# Patient Record
Sex: Female | Born: 1970 | Race: White | Hispanic: No | Marital: Single | Smoking: Current every day smoker
Health system: Southern US, Community
[De-identification: ages and names within clinical notes are randomized; demographics above are authoritative.]

## PROBLEM LIST (undated history)

## (undated) DIAGNOSIS — E119 Type 2 diabetes mellitus without complications: Secondary | ICD-10-CM

## (undated) DIAGNOSIS — I10 Essential (primary) hypertension: Secondary | ICD-10-CM

---

## 1997-12-30 ENCOUNTER — Ambulatory Visit (HOSPITAL_COMMUNITY): Admission: RE | Admit: 1997-12-30 | Discharge: 1997-12-30 | Payer: Self-pay | Admitting: Obstetrics and Gynecology

## 1998-07-01 ENCOUNTER — Inpatient Hospital Stay (HOSPITAL_COMMUNITY): Admission: AD | Admit: 1998-07-01 | Discharge: 1998-07-01 | Payer: Self-pay | Admitting: Obstetrics

## 1998-07-01 ENCOUNTER — Encounter: Payer: Self-pay | Admitting: Obstetrics and Gynecology

## 1999-06-29 ENCOUNTER — Other Ambulatory Visit: Admission: RE | Admit: 1999-06-29 | Discharge: 1999-06-29 | Payer: Self-pay | Admitting: Obstetrics and Gynecology

## 2000-07-06 ENCOUNTER — Other Ambulatory Visit: Admission: RE | Admit: 2000-07-06 | Discharge: 2000-07-06 | Payer: Self-pay | Admitting: Obstetrics and Gynecology

## 2001-11-20 ENCOUNTER — Other Ambulatory Visit: Admission: RE | Admit: 2001-11-20 | Discharge: 2001-11-20 | Payer: Self-pay | Admitting: Obstetrics and Gynecology

## 2001-11-27 ENCOUNTER — Encounter: Admission: RE | Admit: 2001-11-27 | Discharge: 2001-11-27 | Payer: Self-pay | Admitting: Obstetrics and Gynecology

## 2001-11-27 ENCOUNTER — Encounter: Payer: Self-pay | Admitting: Obstetrics and Gynecology

## 2003-01-22 ENCOUNTER — Other Ambulatory Visit: Admission: RE | Admit: 2003-01-22 | Discharge: 2003-01-22 | Payer: Self-pay | Admitting: Obstetrics and Gynecology

## 2003-08-13 ENCOUNTER — Emergency Department (HOSPITAL_COMMUNITY): Admission: EM | Admit: 2003-08-13 | Discharge: 2003-08-13 | Payer: Self-pay | Admitting: Emergency Medicine

## 2003-11-11 ENCOUNTER — Emergency Department (HOSPITAL_COMMUNITY): Admission: EM | Admit: 2003-11-11 | Discharge: 2003-11-11 | Payer: Self-pay | Admitting: Family Medicine

## 2005-03-11 ENCOUNTER — Other Ambulatory Visit: Admission: RE | Admit: 2005-03-11 | Discharge: 2005-03-11 | Payer: Self-pay | Admitting: Obstetrics and Gynecology

## 2006-01-11 ENCOUNTER — Emergency Department (HOSPITAL_COMMUNITY): Admission: EM | Admit: 2006-01-11 | Discharge: 2006-01-12 | Payer: Self-pay | Admitting: Emergency Medicine

## 2006-06-30 ENCOUNTER — Encounter: Admission: RE | Admit: 2006-06-30 | Discharge: 2006-06-30 | Payer: Self-pay | Admitting: Obstetrics and Gynecology

## 2008-06-22 ENCOUNTER — Emergency Department (HOSPITAL_COMMUNITY): Admission: EM | Admit: 2008-06-22 | Discharge: 2008-06-22 | Payer: Self-pay | Admitting: Family Medicine

## 2008-07-07 ENCOUNTER — Emergency Department (HOSPITAL_COMMUNITY): Admission: EM | Admit: 2008-07-07 | Discharge: 2008-07-07 | Payer: Self-pay | Admitting: Family Medicine

## 2008-09-05 ENCOUNTER — Encounter: Admission: RE | Admit: 2008-09-05 | Discharge: 2008-09-05 | Payer: Self-pay | Admitting: Obstetrics and Gynecology

## 2010-08-19 LAB — STREP A DNA PROBE: Group A Strep Probe: NEGATIVE

## 2010-08-19 LAB — POCT RAPID STREP A (OFFICE): Streptococcus, Group A Screen (Direct): NEGATIVE

## 2014-02-13 ENCOUNTER — Other Ambulatory Visit: Payer: Self-pay | Admitting: Obstetrics and Gynecology

## 2014-02-13 DIAGNOSIS — N6310 Unspecified lump in the right breast, unspecified quadrant: Secondary | ICD-10-CM

## 2014-02-20 ENCOUNTER — Other Ambulatory Visit: Payer: Self-pay

## 2014-03-28 ENCOUNTER — Ambulatory Visit
Admission: RE | Admit: 2014-03-28 | Discharge: 2014-03-28 | Disposition: A | Discharge status: Home / Self Care | Payer: 59 | Source: Ambulatory Visit | Attending: Obstetrics and Gynecology | Admitting: Obstetrics and Gynecology

## 2014-03-28 DIAGNOSIS — N6310 Unspecified lump in the right breast, unspecified quadrant: Secondary | ICD-10-CM

## 2014-07-18 ENCOUNTER — Encounter (HOSPITAL_COMMUNITY): Payer: Self-pay | Admitting: *Deleted

## 2014-07-18 ENCOUNTER — Emergency Department (HOSPITAL_COMMUNITY)
Admission: EM | Admit: 2014-07-18 | Discharge: 2014-07-18 | Disposition: A | Discharge status: Home / Self Care | Payer: 59 | Attending: Emergency Medicine | Admitting: Emergency Medicine

## 2014-07-18 DIAGNOSIS — Z720 Tobacco use: Secondary | ICD-10-CM | POA: Insufficient documentation

## 2014-07-18 DIAGNOSIS — I10 Essential (primary) hypertension: Secondary | ICD-10-CM | POA: Diagnosis not present

## 2014-07-18 DIAGNOSIS — K1379 Other lesions of oral mucosa: Secondary | ICD-10-CM | POA: Diagnosis present

## 2014-07-18 DIAGNOSIS — K122 Cellulitis and abscess of mouth: Secondary | ICD-10-CM | POA: Insufficient documentation

## 2014-07-18 HISTORY — DX: Essential (primary) hypertension: I10

## 2014-07-18 MED ORDER — NAPROXEN 500 MG PO TABS
500.0000 mg | ORAL_TABLET | Freq: Two times a day (BID) | ORAL | Status: DC
Start: 2014-07-18 — End: 2014-12-13

## 2014-07-18 MED ORDER — HYDROMORPHONE HCL 1 MG/ML IJ SOLN
2.0000 mg | Freq: Once | INTRAMUSCULAR | Status: AC
Start: 2014-07-18 — End: 2014-07-18
  Administered 2014-07-18: 2 mg via INTRAMUSCULAR
  Filled 2014-07-18: qty 2

## 2014-07-18 MED ORDER — BUPIVACAINE-EPINEPHRINE (PF) 0.5% -1:200000 IJ SOLN
1.8000 mL | Freq: Once | INTRAMUSCULAR | Status: AC
  Administered 2014-07-18: 1.8 mL
  Filled 2014-07-18: qty 1.8

## 2014-07-18 MED ORDER — PENICILLIN V POTASSIUM 500 MG PO TABS: 500.0000 mg | ORAL_TABLET | Freq: Four times a day (QID) | ORAL | Status: AC

## 2014-07-18 MED ORDER — HYDROCODONE-ACETAMINOPHEN 5-325 MG PO TABS: 1.0000 | ORAL_TABLET | ORAL | Status: DC | PRN

## 2014-07-18 MED ORDER — ONDANSETRON 4 MG PO TBDP
8.0000 mg | ORAL_TABLET | Freq: Once | ORAL | Status: AC
  Administered 2014-07-18: 8 mg via ORAL
  Filled 2014-07-18: qty 2

## 2014-07-18 NOTE — Discharge Instructions (Signed)
Please follow the directions provided. Be sure to attend your dental visit for further management of this pain. Please take naproxen twice a day to help with pain. You may take Vicodin for pain not relieved by the naproxen. Please take the penicillin until it is all gone to help with infection. Don't hesitate to return for any new, worsening, or concerning symptoms.   SEEK IMMEDIATE MEDICAL CARE IF:  You develop increased pain, swelling, redness, drainage, or bleeding in the wound site.  You develop signs of generalized infection including muscle aches, chills, fever, or a general ill feeling.  An oral temperature above 102 F (38.9 C) develops, not controlled by medication.

## 2014-07-18 NOTE — ED Notes (Signed)
The pt has a lesion in the   Top of her mouth since Wednesday.  C/o extreme pain.Marland Kitchen.  lmp none iud

## 2014-07-18 NOTE — ED Provider Notes (Signed)
CSN: [id number]     Arrival date & time 07/18/14  1949 History   First MD Initiated Contact with Patient 07/18/14 2124     Chief Complaint  Patient presents with  . Mouth Lesions    (Consider location/radiation/quality/duration/timing/severity/associated sxs/prior Treatment) HPI  Terri Black is a 44 year old female presenting with pain in the roof of her mouth. She states she noticed a swollen area behind her front teeth 2 days ago. She states the pain and swelling got progressively worse. She went to [place] dentist yesterday who did x-rays and told her her teeth were okay but she has had infection in her gums. She describes the pain as aching and rates as 10/10. She denies any fevers, chills, nausea, vomiting or recent injury to the area.     Past Medical History  Diagnosis Date  . Hypertension    History reviewed. No pertinent past surgical history. No family history on file. History  Substance Use Topics  . Smoking status: Current Every Day Smoker  . Smokeless tobacco: Not on file  . Alcohol Use: Yes   OB History    No data available     Review of Systems  Constitutional: Negative for fever and chills.  HENT: Positive for mouth sores.   Gastrointestinal: Negative for nausea and vomiting.  Skin: Positive for wound.    Allergies  Review of patient's allergies indicates no known allergies.  Home Medications   Prior to Admission medications   Not on File   BP 190/94 mmHg  Pulse 98  Temp(Src) 98.7 F (37.1 C) (Oral)  Resp 24  Ht  (1.676 m)  Wt 270 lb (122.471 kg)  BMI 43.60 kg/m2  SpO2 98% Physical Exam  Constitutional: She appears well-developed and well-nourished. No distress.  HENT:  Head: Normocephalic and atraumatic.  Mouth/Throat:    Swelling, erythema and fluctuance to hard palate directly behind left upper incisor.   Eyes: Conjunctivae are normal.  Neck: Neck supple.  Cardiovascular: Normal rate, regular rhythm and intact distal pulses.     Pulmonary/Chest: Effort normal and breath sounds normal. No respiratory distress.  Lymphadenopathy:    She has no cervical adenopathy.  Neurological: She is alert.  Skin: Skin is warm and dry. No rash noted. She is not diaphoretic.  Psychiatric: She has a normal mood and affect.  Nursing note and vitals reviewed.   ED Course  Procedures (including critical care time)  INCISION AND DRAINAGE Performed by: Harle Battiest Consent: Verbal consent obtained. Risks and benefits: risks, benefits and alternatives were discussed Type: abscess  Body area: hard palate  Anesthesia: local infiltration  Aspirated with 18 ga needle.  Local anesthetic: lidocaine 1% without epinephrine  Anesthetic total: 2 ml  Complexity: simple  Drainage: purulent  Drainage amount: Small  Packing material: None  Patient tolerance: Patient tolerated the procedure well with no immediate complications.  Labs Review Labs Reviewed - No data to display  Imaging Review No results found.   EKG Interpretation None      MDM   Final diagnoses:  Oral abscess   44 yo with swelling, erythema and fluctuance to the hard palate. Pt numbed with local infiltration and small amount of purulent discharge aspirated with 18 ga needle.  Pt reports relief of pain. Exam unconcerning for spread of infection. Will treat with penicillin and pain medicine.  Pt is well-appearing, in no acute distress and vital signs reviewed and not concerning. She appears safe to be discharged.  Discharge include follow-up with  her dentist. Return precautions provided. Pt aware of plan and in agreement.    Filed Vitals:   07/18/14 2013  BP: 190/94  Pulse: 98  Temp: 98.7 F (37.1 C)  TempSrc: Oral  Resp: 24  Height: 5\' 6"  (1.676 m)  Weight: 270 lb (122.471 kg)  SpO2: 98%   Meds given in ED:  Medications  HYDROmorphone (DILAUDID) injection 2 mg (2 mg Intramuscular Given 07/18/14 2142)  ondansetron (ZOFRAN-ODT)  disintegrating tablet 8 mg (8 mg Oral Given 07/18/14 2144)  bupivacaine-epinephrine (MARCAINE W/ EPI) 0.5% -1:200000 injection 1.8 mL (1.8 mLs Infiltration Given 07/18/14 2144)    Discharge Medication List as of 07/18/2014 10:24 PM    START taking these medications   Details  HYDROcodone-acetaminophen (NORCO/VICODIN) 5-325 MG per tablet Take 1 tablet by mouth every 4 (four) hours as needed., Starting 07/18/2014, Until Discontinued, Print    naproxen (NAPROSYN) 500 MG tablet Take 1 tablet (500 mg total) by mouth 2 (two) times daily., Starting 07/18/2014, Until Discontinued, Print    penicillin v potassium (VEETID) 500 MG tablet Take 1 tablet (500 mg total) by mouth 4 (four) times daily., Starting 07/18/2014, Until Fri 07/25/14, Print           Harle BattiestElizabeth Ebba Goll, NP 07/19/14 1539  Nelva Nayobert Beaton, MD 07/20/14 [phone]

## 2014-09-09 ENCOUNTER — Other Ambulatory Visit: Payer: Self-pay | Admitting: Obstetrics & Gynecology

## 2014-09-09 DIAGNOSIS — R928 Other abnormal and inconclusive findings on diagnostic imaging of breast: Secondary | ICD-10-CM

## 2014-12-13 ENCOUNTER — Emergency Department (HOSPITAL_COMMUNITY)
Admission: EM | Admit: 2014-12-13 | Discharge: 2014-12-13 | Disposition: A | Discharge status: Home / Self Care | Payer: 59 | Attending: Emergency Medicine | Admitting: Emergency Medicine

## 2014-12-13 ENCOUNTER — Encounter (HOSPITAL_COMMUNITY): Payer: Self-pay

## 2014-12-13 DIAGNOSIS — E119 Type 2 diabetes mellitus without complications: Secondary | ICD-10-CM | POA: Insufficient documentation

## 2014-12-13 DIAGNOSIS — Z79899 Other long term (current) drug therapy: Secondary | ICD-10-CM | POA: Diagnosis not present

## 2014-12-13 DIAGNOSIS — N764 Abscess of vulva: Secondary | ICD-10-CM | POA: Insufficient documentation

## 2014-12-13 DIAGNOSIS — Z720 Tobacco use: Secondary | ICD-10-CM | POA: Insufficient documentation

## 2014-12-13 DIAGNOSIS — I10 Essential (primary) hypertension: Secondary | ICD-10-CM | POA: Diagnosis not present

## 2014-12-13 HISTORY — DX: Type 2 diabetes mellitus without complications: E11.9

## 2014-12-13 LAB — CBG MONITORING, ED: GLUCOSE-CAPILLARY: 121 mg/dL — AB (ref 65–99)

## 2014-12-13 MED ORDER — LIDOCAINE-EPINEPHRINE (PF) 2 %-1:200000 IJ SOLN
10.0000 mL | Freq: Once | INTRAMUSCULAR | Status: AC
  Administered 2014-12-13: 10 mL
  Filled 2014-12-13: qty 20

## 2014-12-13 MED ORDER — OXYCODONE-ACETAMINOPHEN 5-325 MG PO TABS: 2.0000 | ORAL_TABLET | Freq: Once | ORAL | Status: DC

## 2014-12-13 MED ORDER — DOXYCYCLINE HYCLATE 100 MG PO CAPS: 100.0000 mg | ORAL_CAPSULE | Freq: Two times a day (BID) | ORAL | Status: DC

## 2014-12-13 MED ORDER — NAPROXEN 500 MG PO TABS: 500.0000 mg | ORAL_TABLET | Freq: Two times a day (BID) | ORAL | Status: AC

## 2014-12-13 MED ORDER — HYDROCODONE-ACETAMINOPHEN 5-325 MG PO TABS
1.0000 | ORAL_TABLET | Freq: Four times a day (QID) | ORAL | Status: DC | PRN
Start: 2014-12-13 — End: 2016-11-23

## 2014-12-13 NOTE — ED Provider Notes (Signed)
CSN: [id number]     Arrival date & time 12/13/14  0828 History   First MD Initiated Contact with Patient 12/13/14 [phone]     Chief Complaint  Patient presents with  . Abscess     (Consider location/radiation/quality/duration/timing/severity/associated sxs/prior Treatment) HPI Comments: Patient presents today with an abscess of the left labia.  She states that the abscess has been present for the past 2 days and is becoming larger.  Abscess is very painful.  She has not noticed any drainage.  She has been taking Ibuprofen for pain with little relief.  Pain worse when sitting and with palpation.  She denies any history of abscesses.  She does report a history of DM Type II.  She reports that her blood sugar has been running around 130.  She denies nausea, vomiting, fever, or chills.   Patient is a 44 y.o. female presenting with abscess. The history is provided by the patient.  Abscess   Past Medical History  Diagnosis Date  . Hypertension   . Diabetes mellitus without complication     Type 2   Past Surgical History  Procedure Laterality Date  . Cesarean section     No family history on file. History  Substance Use Topics  . Smoking status: Current Every Day Smoker -- 0.50 packs/day  . Smokeless tobacco: Not on file  . Alcohol Use: No   OB History    No data available     Review of Systems  All other systems reviewed and are negative.     Allergies  Shellfish allergy  Home Medications   Prior to Admission medications   Medication Sig Start Date End Date Taking? Authorizing Provider  canagliflozin (INVOKANA) 300 MG TABS tablet Take 300 mg by mouth daily before breakfast.   Yes Historical Provider, MD  citalopram (CELEXA) 20 MG tablet Take 20 mg by mouth daily.   Yes Historical Provider, MD  insulin detemir (LEVEMIR) 100 UNIT/ML injection Inject 20 Units into the skin daily.   Yes Historical Provider, MD  POTASSIUM PO Take 1 tablet by mouth daily.   Yes Historical  Provider, MD  sitaGLIPtin-metformin (JANUMET) 50-1000 MG per tablet Take 1 tablet by mouth daily.   Yes Historical Provider, MD  spironolactone-hydrochlorothiazide (ALDACTAZIDE) 25-25 MG per tablet Take 1 tablet by mouth daily.   Yes Historical Provider, MD  HYDROcodone-acetaminophen (NORCO/VICODIN) 5-325 MG per tablet Take 1 tablet by mouth every 4 (four) hours as needed. Patient not taking: Reported on 12/13/2014 07/18/14   Harle Battiest, NP  naproxen (NAPROSYN) 500 MG tablet Take 1 tablet (500 mg total) by mouth 2 (two) times daily. Patient not taking: Reported on 12/13/2014 07/18/14   Harle Battiest, NP   BP 130/62 mmHg  Pulse 91  Temp(Src) 98.4 F (36.9 C) (Oral)  Resp 16  Ht  (1.676 m)  Wt 232 lb (105.235 kg)  BMI 37.46 kg/m2  SpO2 97% Physical Exam  Constitutional: She appears well-developed and well-nourished.  HENT:  Head: Normocephalic and atraumatic.  Mouth/Throat: Oropharynx is clear and moist.  Neck: Normal range of motion. Neck supple.  Cardiovascular: Normal rate, regular rhythm and normal heart sounds.   Pulmonary/Chest: Effort normal and breath sounds normal.  Abdominal: Soft. Bowel sounds are normal. She exhibits no distension and no mass. There is no tenderness. There is no rebound and no guarding.  Genitourinary:  Diffuse edema and erythema of the left labia.  No drainage.  No warmth.  No obvious fluctuance.    Neurological:  She is alert.  Skin: Skin is warm and dry.  Nursing note and vitals reviewed.   ED Course  Procedures (including critical care time) Labs Review Labs Reviewed  CBG MONITORING, ED    Imaging Review No results found.   EKG Interpretation None     INCISION AND DRAINAGE Performed by: Santiago Glad Consent: Verbal consent obtained. Risks and benefits: risks, benefits and alternatives were discussed Type: abscess  Body area: left labia  Anesthesia: local infiltration  Incision was made with a scalpel.  Local  anesthetic: lidocaine 2% with epinephrine  Anesthetic total: 6 ml  Complexity: complex Blunt dissection to break up loculations  Drainage: purulent  Drainage amount: small  Patient tolerance: Patient tolerated the procedure well with no immediate complications.    MDM   Final diagnoses:  None   Patient presents today with a chief complaint of a left labial abscess x 2 days.  Area very tender to palpation.  Area incised and drained in the ED with small amount of drainage.  She does have diffuse edema of the left labia.  She denies any recent sexual intercourse.  Patient started on antibiotics and instructed to use warm compresses.  Instructed to follow up with PCP in 2 days to have the area rechecked.  Patient stable for discharge.  Return precautions given.      Santiago Glad, PA-C 12/13/14 1037  Azalia Bilis, MD 12/13/14 [phone]

## 2014-12-13 NOTE — ED Notes (Signed)
Pt. Presents with complaint of potential abscess to vaginal/buttock area. Pt. States she noticed it approx 2 days ago. Pain and size worsening since.

## 2014-12-13 NOTE — ED Notes (Signed)
Pt departing ED without any complaints. Denies pain. Verbalizes understanding of discharge instructions. NAD. A/O x4. Ambulatory with steady gait

## 2015-01-03 ENCOUNTER — Encounter (HOSPITAL_COMMUNITY): Payer: Self-pay | Admitting: Emergency Medicine

## 2015-01-03 ENCOUNTER — Emergency Department (HOSPITAL_COMMUNITY)
Admission: EM | Admit: 2015-01-03 | Discharge: 2015-01-03 | Disposition: A | Discharge status: Home / Self Care | Payer: 59 | Attending: Emergency Medicine | Admitting: Emergency Medicine

## 2015-01-03 DIAGNOSIS — X30XXXA Exposure to excessive natural heat, initial encounter: Secondary | ICD-10-CM | POA: Insufficient documentation

## 2015-01-03 DIAGNOSIS — Z791 Long term (current) use of non-steroidal anti-inflammatories (NSAID): Secondary | ICD-10-CM | POA: Diagnosis not present

## 2015-01-03 DIAGNOSIS — Y998 Other external cause status: Secondary | ICD-10-CM | POA: Diagnosis not present

## 2015-01-03 DIAGNOSIS — T675XXA Heat exhaustion, unspecified, initial encounter: Secondary | ICD-10-CM | POA: Diagnosis not present

## 2015-01-03 DIAGNOSIS — I10 Essential (primary) hypertension: Secondary | ICD-10-CM | POA: Insufficient documentation

## 2015-01-03 DIAGNOSIS — Z794 Long term (current) use of insulin: Secondary | ICD-10-CM | POA: Diagnosis not present

## 2015-01-03 DIAGNOSIS — Y9289 Other specified places as the place of occurrence of the external cause: Secondary | ICD-10-CM | POA: Diagnosis not present

## 2015-01-03 DIAGNOSIS — E119 Type 2 diabetes mellitus without complications: Secondary | ICD-10-CM | POA: Insufficient documentation

## 2015-01-03 DIAGNOSIS — E876 Hypokalemia: Secondary | ICD-10-CM | POA: Diagnosis not present

## 2015-01-03 DIAGNOSIS — Z79899 Other long term (current) drug therapy: Secondary | ICD-10-CM | POA: Diagnosis not present

## 2015-01-03 DIAGNOSIS — R112 Nausea with vomiting, unspecified: Secondary | ICD-10-CM | POA: Diagnosis present

## 2015-01-03 DIAGNOSIS — Z720 Tobacco use: Secondary | ICD-10-CM | POA: Insufficient documentation

## 2015-01-03 DIAGNOSIS — Y9389 Activity, other specified: Secondary | ICD-10-CM | POA: Diagnosis not present

## 2015-01-03 LAB — I-STAT CHEM 8, ED
BUN: 14 mg/dL (ref 6–20)
CALCIUM ION: 1.15 mmol/L (ref 1.12–1.23)
CREATININE: 0.9 mg/dL (ref 0.44–1.00)
Chloride: 103 mmol/L (ref 101–111)
GLUCOSE: 135 mg/dL — AB (ref 65–99)
HCT: 46 % (ref 36.0–46.0)
HEMOGLOBIN: 15.6 g/dL — AB (ref 12.0–15.0)
Potassium: 3.2 mmol/L — ABNORMAL LOW (ref 3.5–5.1)
Sodium: 140 mmol/L (ref 135–145)
TCO2: 21 mmol/L (ref 0–100)

## 2015-01-03 MED ORDER — POTASSIUM CHLORIDE CRYS ER 20 MEQ PO TBCR
40.0000 meq | EXTENDED_RELEASE_TABLET | Freq: Once | ORAL | Status: AC
  Administered 2015-01-03: 40 meq via ORAL
  Filled 2015-01-03: qty 2

## 2015-01-03 NOTE — ED Provider Notes (Signed)
CSN: [id number]     Arrival date & time 01/03/15  1016 History   First MD Initiated Contact with Patient 01/03/15 1025     Chief Complaint  Patient presents with  . Nausea  . Emesis  . Dizziness     (Consider location/radiation/quality/duration/timing/severity/associated sxs/prior Treatment) HPI Comments: Patient presents with nausea and vomiting. She has a history of hypertension and diabetes. She was running a 5K this morning. She states she got about 2 miles and started feeling lightheaded. She stopped running and started walking. She did have multiple episodes of watery emesis. It was nonbloody and nonbilious. She states she did not eat breakfast this morning. She was drinking water. She states this is the first 5K that she has run since she's recently started exercising. She was given 700 cc of normal saline prior to arrival by EMS. She states she feels 100% better right now. She denies any ongoing nausea. She denies abdominal pain. She states she was feeling fine yesterday and this morning. She denies any other recent illnesses. There's been no chest pain or shortness of breath. No urinary symptoms. She has an IUD in place.  Patient is a 44 y.o. female presenting with vomiting and dizziness.  Emesis Associated symptoms: no abdominal pain, no arthralgias, no chills, no diarrhea and no headaches   Dizziness Associated symptoms: nausea and vomiting   Associated symptoms: no blood in stool, no chest pain, no diarrhea, no headaches, no shortness of breath and no weakness     Past Medical History  Diagnosis Date  . Hypertension   . Diabetes mellitus without complication     Type 2   Past Surgical History  Procedure Laterality Date  . Cesarean section     No family history on file. Social History  Substance Use Topics  . Smoking status: Current Every Day Smoker -- 0.50 packs/day  . Smokeless tobacco: None  . Alcohol Use: No   OB History    No data available     Review of  Systems  Constitutional: Positive for fatigue. Negative for fever, chills and diaphoresis.  HENT: Negative for congestion, rhinorrhea and sneezing.   Eyes: Negative.   Respiratory: Negative for cough, chest tightness and shortness of breath.   Cardiovascular: Negative for chest pain and leg swelling.  Gastrointestinal: Positive for nausea and vomiting. Negative for abdominal pain, diarrhea and blood in stool.  Genitourinary: Negative for frequency, hematuria, flank pain and difficulty urinating.  Musculoskeletal: Negative for back pain and arthralgias.  Skin: Negative for rash.  Neurological: Positive for dizziness and light-headedness. Negative for speech difficulty, weakness, numbness and headaches.      Allergies  Shellfish allergy  Home Medications   Prior to Admission medications   Medication Sig Start Date End Date Taking? Authorizing Provider  canagliflozin (INVOKANA) 300 MG TABS tablet Take 300 mg by mouth daily before breakfast.    Historical Provider, MD  citalopram (CELEXA) 20 MG tablet Take 20 mg by mouth daily.    Historical Provider, MD  doxycycline (VIBRAMYCIN) 100 MG capsule Take 1 capsule (100 mg total) by mouth 2 (two) times daily. 12/13/14   Santiago Glad, PA-C  HYDROcodone-acetaminophen (NORCO/VICODIN) 5-325 MG per tablet Take 1-2 tablets by mouth every 6 (six) hours as needed. 12/13/14   Heather Laisure, PA-C  insulin detemir (LEVEMIR) 100 UNIT/ML injection Inject 20 Units into the skin daily.    Historical Provider, MD  naproxen (NAPROSYN) 500 MG tablet Take 1 tablet (500 mg total) by mouth 2 (two)  times daily. 12/13/14   Santiago Glad, PA-C  POTASSIUM PO Take 1 tablet by mouth daily.    Historical Provider, MD  sitaGLIPtin-metformin (JANUMET) 50-1000 MG per tablet Take 1 tablet by mouth daily.    Historical Provider, MD  spironolactone-hydrochlorothiazide (ALDACTAZIDE) 25-25 MG per tablet Take 1 tablet by mouth daily.    Historical Provider, MD   BP 133/72 mmHg   Pulse 98  Temp(Src) 97.9 F (36.6 C) (Oral)  Resp 20  SpO2 98% Physical Exam  Constitutional: She is oriented to person, place, and time. She appears well-developed and well-nourished.  HENT:  Head: Normocephalic and atraumatic.  Eyes: Pupils are equal, round, and reactive to light.  Neck: Normal range of motion. Neck supple.  Cardiovascular: Normal rate, regular rhythm and normal heart sounds.   Pulmonary/Chest: Effort normal and breath sounds normal. No respiratory distress. She has no wheezes. She has no rales. She exhibits no tenderness.  Abdominal: Soft. Bowel sounds are normal. There is no tenderness. There is no rebound and no guarding.  Musculoskeletal: Normal range of motion. She exhibits no edema.  Lymphadenopathy:    She has no cervical adenopathy.  Neurological: She is alert and oriented to person, place, and time.  Skin: Skin is warm and dry. No rash noted.  Psychiatric: She has a normal mood and affect.    ED Course  Procedures (including critical care time) Labs Review Labs Reviewed  I-STAT CHEM 8, ED - Abnormal; Notable for the following:    Potassium 3.2 (*)    Glucose, Bld 135 (*)    Hemoglobin 15.6 (*)    All other components within normal limits    Imaging Review No results found. I have personally reviewed and evaluated these images and lab results as part of my medical decision-making.   EKG Interpretation None      MDM   Final diagnoses:  Heat exhaustion, initial encounter  Hypokalemia    Patient is very much better after being brought into a cooler environment and given IV fluids. She's been asymptomatic since arrival to the ED. She's eaten some crackers without difficulty. Her potassium was slightly low and she was given a dose of K Dur here in the ED. I advised her to not take her regular dose of potassium today but to start back on her regular dose tomorrow. I advised her follow-up with her primary care physician if she has any ongoing  symptoms or return here as needed for any worsening symptoms.    Rolan Bucco, MD 01/03/15 [phone]

## 2015-01-03 NOTE — ED Notes (Signed)
[facility]. From scene. Running in 5k this am. Felt overheated, multiple emesis 18g Left AC. NS given

## 2015-01-03 NOTE — Discharge Instructions (Signed)
Heat-Related Illness °Heat-related illnesses occur when the body is unable to properly cool itself. The body normally cools itself by sweating. However, under some conditions sweating is not enough. In these cases, a person's body temperature rises rapidly. Very high body temperatures may damage the brain or other vital organs. Some examples of heat-related illnesses include: °· Heat stroke. This occurs when the body is unable to regulate its temperature. The body's temperature rises rapidly, the sweating mechanism fails, and the body is unable to cool down. Body temperature may rise to 106° F (41° C) or higher within 10 to 15 minutes. Heat stroke can cause death or permanent disability if emergency treatment is not provided. °· Heat exhaustion. This is a milder form of heat-related illness that can develop after several days of exposure to high temperatures and not enough fluids. It is the body's response to an excessive loss of the water and salt contained in sweat. °· Heat cramps. These usually affect people who sweat a lot during heavy activity. This sweating drains the body's salt and moisture. The low salt level in the muscles causes painful cramps. Heat cramps may also be a symptom of heat exhaustion. Heat cramps usually occur in the abdomen, arms, or legs. Get medical attention for cramps if you have heart problems or are on a low-sodium diet. °Those that are at greatest risk for heat-related illnesses include:  °· The elderly. °· Infant and the very young. °· People with mental illness and chronic diseases. °· People who are overweight (obese). °· Young and healthy people can even succumb to heat if they participate in strenuous physical activities during hot weather. °CAUSES  °Several factors affect the body's ability to cool itself during extremely hot weather. When the humidity is high, sweat will not evaporate as quickly. This prevents the body from releasing heat quickly. Other factors that can affect  the body's ability to cool down include:  °· Age. °· Obesity. °· Fever. °· Dehydration. °· Heart disease. °· Mental illness. °· Poor circulation. °· Sunburn. °· Prescription drug use. °· Alcohol use. °SYMPTOMS  °Heat stroke: Warning signs of heat stroke vary, but may include: °· An extremely high body temperature (above 103°F orally). °· A fast, strong pulse. °· Dizziness. °· Confusion. °· Red, hot, and dry skin. °· No sweating. °· Throbbing headache. °· Feeling sick to your stomach (nauseous). °· Unconsciousness. °Heat exhaustion: Warning signs of heat exhaustion include: °· Heavy sweating. °· Tiredness. °· Headache. °· Paleness. °· Weakness. °· Feeling sick to your stomach (nauseous) or vomiting. °· Muscle cramps. °Heat cramps °· Muscle pains or spasms. °TREATMENT  °Heat stroke °· Get into a cool environment. An indoor place that is air-conditioned may be best. °· Take a cool shower or bath. Have someone around to make sure you are okay. °· Take your temperature. Make sure it is going down. °Heat exhaustion °· Drink plenty of fluids. Do not drink liquids that contain caffeine, alcohol, or large amounts of sugar. These cause you to lose more body fluid. Also, avoid very cold drinks. They can cause stomach cramps. °· Get into a cool environment. An indoor place that is air-conditioned may be best. °· Take a cool shower or bath. Have someone around to make sure you are okay. °· Put on lightweight clothing. °Heat cramps °· Stop whatever activity you were doing. Do not attempt to do that activity for at least 3 hours after the cramps have gone away. °· Get into a cool environment. An indoor   place that is air-conditioned may be best. HOME CARE INSTRUCTIONS  To protect your health when temperatures are extremely high, follow these tips:  During heavy exercise in a hot environment, drink two to four glasses (16-32 ounces) of cool fluids each hour. Do not wait until you are thirsty to drink. Warning: If your caregiver  limits the amount of fluid you drink or has you on water pills, ask how much you should drink while the weather is hot.  Do not drink liquids that contain caffeine, alcohol, or large amounts of sugar. These cause you to lose more body fluid.  Avoid very cold drinks. They can cause stomach cramps.  Wear appropriate clothing. Choose lightweight, light-colored, loose-fitting clothing.  If you must be outdoors, try to limit your outdoor activity to morning and evening hours. Try to rest often in shady areas.  If you are not used to working or exercising in a hot environment, start slowly and pick up the pace gradually.  Stay cool in an air-conditioned place if possible. If your home does not have air conditioning, go to the shopping mall or [employer].  Taking a cool shower or bath may help you cool off. SEEK MEDICAL CARE IF:   You see any of the symptoms listed above. You may be dealing with a life-threatening emergency.  Symptoms worsen or last longer than 1 hour.  Heat cramps do not get better in 1 hour. MAKE SURE YOU:   Understand these instructions.  Will watch your condition.  Will get help right away if you are not doing well or get worse. Document Released: 02/02/2008 Document Revised: 07/18/2011 Document Reviewed: 02/02/2008 [facility] Patient Information 2015 [place], [place]. This information is not intended to replace advice given to you by your health care provider. Make sure you discuss any questions you have with your health care provider.  Hypokalemia Hypokalemia means that the amount of potassium in the blood is lower than normal.Potassium is a chemical, called an electrolyte, that helps regulate the amount of fluid in the body. It also stimulates muscle contraction and helps nerves function properly.Most of the body's potassium is inside of cells, and only a very small amount is in the blood. Because the amount in the blood is so small, minor changes can be  life-threatening. CAUSES  Antibiotics.  Diarrhea or vomiting.  Using laxatives too much, which can cause diarrhea.  Chronic kidney disease.  Water pills (diuretics).  Eating disorders (bulimia).  Low magnesium level.  Sweating a lot. SIGNS AND SYMPTOMS  Weakness.  Constipation.  Fatigue.  Muscle cramps.  Mental confusion.  Skipped heartbeats or irregular heartbeat (palpitations).  Tingling or numbness. DIAGNOSIS  Your health care provider can diagnose hypokalemia with blood tests. In addition to checking your potassium level, your health care provider may also check other lab tests. TREATMENT Hypokalemia can be treated with potassium supplements taken by mouth or adjustments in your current medicines. If your potassium level is very low, you may need to get potassium through a vein (IV) and be monitored in the hospital. A diet high in potassium is also helpful. Foods high in potassium are:  Nuts, such as peanuts and pistachios.  Seeds, such as sunflower seeds and pumpkin seeds.  Peas, lentils, and lima beans.  Whole grain and bran cereals and breads.  Fresh fruit and vegetables, such as apricots, avocado, bananas, cantaloupe, kiwi, oranges, tomatoes, asparagus, and potatoes.  Orange and tomato juices.  Red meats.  Fruit yogurt. HOME CARE INSTRUCTIONS  Take  all medicines as prescribed by your health care provider.  Maintain a healthy diet by including nutritious food, such as fruits, vegetables, nuts, whole grains, and lean meats.  If you are taking a laxative, be sure to follow the directions on the label. SEEK MEDICAL CARE IF:  Your weakness gets worse.  You feel your heart pounding or racing.  You are vomiting or having diarrhea.  You are diabetic and having trouble keeping your blood glucose in the normal range. SEEK IMMEDIATE MEDICAL CARE IF:  You have chest pain, shortness of breath, or dizziness.  You are vomiting or having diarrhea for  more than 2 days.  You faint. MAKE SURE YOU:   Understand these instructions.  Will watch your condition.  Will get help right away if you are not doing well or get worse. Document Released: 04/25/2005 Document Revised: 02/13/2013 Document Reviewed: 10/26/2012 [facility] Patient Information 2015 [place], [place]. This information is not intended to replace advice given to you by your health care provider. Make sure you discuss any questions you have with your health care provider.

## 2015-01-03 NOTE — ED Notes (Signed)
Pt via EMS stated, she over-heated , nauseated, vomiting and felt dizzy.  Pt. Didn't eat breakfast, but was hydrated, but vomited several times.

## 2015-02-26 ENCOUNTER — Emergency Department (HOSPITAL_COMMUNITY)
Admission: EM | Admit: 2015-02-26 | Discharge: 2015-02-26 | Disposition: A | Discharge status: Home / Self Care | Payer: 59 | Attending: Physician Assistant | Admitting: Physician Assistant

## 2015-02-26 ENCOUNTER — Encounter (HOSPITAL_COMMUNITY): Payer: Self-pay | Admitting: *Deleted

## 2015-02-26 DIAGNOSIS — L03211 Cellulitis of face: Secondary | ICD-10-CM | POA: Diagnosis not present

## 2015-02-26 DIAGNOSIS — E119 Type 2 diabetes mellitus without complications: Secondary | ICD-10-CM | POA: Diagnosis not present

## 2015-02-26 DIAGNOSIS — I10 Essential (primary) hypertension: Secondary | ICD-10-CM | POA: Diagnosis not present

## 2015-02-26 DIAGNOSIS — Z794 Long term (current) use of insulin: Secondary | ICD-10-CM | POA: Insufficient documentation

## 2015-02-26 DIAGNOSIS — Z720 Tobacco use: Secondary | ICD-10-CM | POA: Insufficient documentation

## 2015-02-26 DIAGNOSIS — Z79899 Other long term (current) drug therapy: Secondary | ICD-10-CM | POA: Insufficient documentation

## 2015-02-26 DIAGNOSIS — R600 Localized edema: Secondary | ICD-10-CM | POA: Diagnosis present

## 2015-02-26 MED ORDER — CLINDAMYCIN HCL 150 MG PO CAPS: 150.0000 mg | ORAL_CAPSULE | Freq: Four times a day (QID) | ORAL | Status: DC

## 2015-02-26 MED ORDER — CLINDAMYCIN HCL 150 MG PO CAPS
300.0000 mg | ORAL_CAPSULE | Freq: Once | ORAL | Status: AC
  Administered 2015-02-26: 300 mg via ORAL
  Filled 2015-02-26: qty 2

## 2015-02-26 NOTE — ED Notes (Signed)
Declined W/C at D/C and was escorted to lobby by RN. 

## 2015-02-26 NOTE — Discharge Instructions (Signed)

## 2015-02-26 NOTE — ED Notes (Addendum)
Pt reports a blister was on her lower lip and she opened  the blister. Now the lower lip ,chin and side of face is swollen . During assessment Pt rubbed her lip with hands . Pt instructed best practice was not to put hands on open area. Pt speaking in full sentences.No resp, distress.

## 2015-02-26 NOTE — ED Provider Notes (Signed)
CSN: [id number]     Arrival date & time 02/26/15  4098 History  By signing my name below, I, Jarvis Morgan, attest that this documentation has been prepared under the direction and in the presence of Marlon Pel, PA-C Electronically Signed: Jarvis Morgan, ED Scribe. 02/26/2015. 10:00 AM.    Chief Complaint  Patient presents with  . Oral Swelling   The history is provided by the patient. No language interpreter was used.    HPI Comments: LEANORE BIGGERS is a 44 y.o. female with a h/o DM who presents to the Emergency Department complaining of intermittent, oral swelling to her lower lip, chin and right side of face. She states the area started as a blister or bite to her lower lip which she then popped that develop on Tuesday, 3 days ago. She reports associated redness to the swelling area. Pt has been taking Benadryl with moderate relief. She states she has also been taking Naprosyn and applying ice to help with the swelling. Pt denies any aggravating factors. She states she has never had anything like this in the past. She denies any throat swelling, trouble swallowing, SOB, fever, chills, nausea or vomiting, weakness or headache. Pt denies any known medication allergies.   Past Medical History  Diagnosis Date  . Hypertension   . Diabetes mellitus without complication (HCC)     Type 2   Past Surgical History  Procedure Laterality Date  . Cesarean section    . Cesarean section     History reviewed. No pertinent family history. Social History  Substance Use Topics  . Smoking status: Current Every Day Smoker -- 0.50 packs/day  . Smokeless tobacco: None  . Alcohol Use: No   OB History    No data available     Review of Systems  Constitutional: Negative for fever and chills.  HENT: Positive for facial swelling. Negative for trouble swallowing.   Respiratory: Negative for shortness of breath.   Gastrointestinal: Negative for nausea and vomiting.  Skin: Positive for color  change.    Allergies  Shellfish allergy  Home Medications   Prior to Admission medications   Medication Sig Start Date End Date Taking? Authorizing Provider  canagliflozin (INVOKANA) 300 MG TABS tablet Take 300 mg by mouth daily before breakfast.    Historical Provider, MD  citalopram (CELEXA) 20 MG tablet Take 20 mg by mouth daily.    Historical Provider, MD  clindamycin (CLEOCIN) 150 MG capsule Take 1 capsule (150 mg total) by mouth every 6 (six) hours. 02/26/15   Marlon Pel, PA-C  doxycycline (VIBRAMYCIN) 100 MG capsule Take 1 capsule (100 mg total) by mouth 2 (two) times daily. 12/13/14   Santiago Glad, PA-C  HYDROcodone-acetaminophen (NORCO/VICODIN) 5-325 MG per tablet Take 1-2 tablets by mouth every 6 (six) hours as needed. 12/13/14   Heather Laisure, PA-C  insulin detemir (LEVEMIR) 100 UNIT/ML injection Inject 20 Units into the skin daily.    Historical Provider, MD  naproxen (NAPROSYN) 500 MG tablet Take 1 tablet (500 mg total) by mouth 2 (two) times daily. 12/13/14   Santiago Glad, PA-C  POTASSIUM PO Take 1 tablet by mouth daily.    Historical Provider, MD  sitaGLIPtin-metformin (JANUMET) 50-1000 MG per tablet Take 1 tablet by mouth daily.    Historical Provider, MD  spironolactone-hydrochlorothiazide (ALDACTAZIDE) 25-25 MG per tablet Take 1 tablet by mouth daily.    Historical Provider, MD   Triage Vitals: BP 123/86 mmHg  Pulse 86  Temp(Src) 98.6 F (37 C) (  Oral)  Resp 16  SpO2 98%  Physical Exam  Constitutional: She is oriented to person, place, and time. She appears well-developed and well-nourished. No distress.  HENT:  Head: Normocephalic and atraumatic.  Cellulitis to the bottom, lateral right lip with a tiny pin point Eschar to center. The swelling is to the chin, mainly on the right. It does not extend down the neck. The inside of her bottom lip is soft and non firm. No trismus. The cellulitis does not extend up past the mandible joint line. Associated induration  and erythema. No frank abscess noted.  Eyes: Conjunctivae and EOM are normal.  Neck: Neck supple. No tracheal deviation present.  Cardiovascular: Normal rate.   Pulmonary/Chest: Effort normal. No respiratory distress.  Musculoskeletal: Normal range of motion.  Neurological: She is alert and oriented to person, place, and time.  Skin: Skin is warm and dry.  Psychiatric: She has a normal mood and affect. Her behavior is normal.  Nursing note and vitals reviewed.   ED Course  Procedures (including critical care time)  DIAGNOSTIC STUDIES: Oxygen Saturation is 98% on RA, normal by my interpretation.    COORDINATION OF CARE:  9:48 AM- will give pt an rx for Clindamycin. Advised pt to return if swelling gets worse or she develops an abscess to the area.  Pt advised of plan for treatment and pt agrees.    Labs Review Labs Reviewed - No data to display  Imaging Review No results found. I have personally reviewed and evaluated these images and lab results as part of my medical decision-making.   EKG Interpretation None      MDM   Final diagnoses:  Cellulitis of face    Patient is had significant risk of are turning into an abscess. We had a long discussion about this. She understands the risk of abscess despite the antibiotics and agrees to return if worsening. She has been started on Clindamycin here in the ED. If she does not improve or worsens she will likely need admission. At this time no puss is expressing from pinpoint eschar. Likely pt was bit by an unknown spider.  Medications  clindamycin (CLEOCIN) capsule 300 mg (300 mg Oral Given 02/26/15 0957)    44 y.o.Lin Givensamala N Ace's medical screening exam was performed and I feel the patient has had an appropriate workup for their chief complaint at this time and likelihood of emergent condition existing is low. They have been counseled on decision, discharge, follow up and which symptoms necessitate immediate return to the  emergency department. They or their family verbally stated understanding and agreement with plan and discharged in stable condition.   Vital signs are stable at discharge. Filed Vitals:   02/26/15 0858  BP: 123/86  Pulse: 86  Temp: 98.6 F (37 C)  Resp: 16    I personally performed the services described in this documentation, which was scribed in my presence. The recorded information has been reviewed and is accurate.    Marlon Peliffany Efrat Zuidema, PA-C 02/26/15 1004  Courteney Randall AnLyn Mackuen, MD 02/26/15 1627

## 2015-02-28 ENCOUNTER — Encounter (HOSPITAL_COMMUNITY): Payer: Self-pay | Admitting: *Deleted

## 2015-02-28 ENCOUNTER — Emergency Department (HOSPITAL_COMMUNITY)
Admission: EM | Admit: 2015-02-28 | Discharge: 2015-02-28 | Disposition: A | Discharge status: Home / Self Care | Payer: 59 | Attending: Emergency Medicine | Admitting: Emergency Medicine

## 2015-02-28 DIAGNOSIS — K130 Diseases of lips: Secondary | ICD-10-CM

## 2015-02-28 DIAGNOSIS — Z720 Tobacco use: Secondary | ICD-10-CM | POA: Insufficient documentation

## 2015-02-28 DIAGNOSIS — Z79899 Other long term (current) drug therapy: Secondary | ICD-10-CM | POA: Diagnosis not present

## 2015-02-28 DIAGNOSIS — Z794 Long term (current) use of insulin: Secondary | ICD-10-CM | POA: Insufficient documentation

## 2015-02-28 DIAGNOSIS — Z23 Encounter for immunization: Secondary | ICD-10-CM | POA: Insufficient documentation

## 2015-02-28 DIAGNOSIS — E119 Type 2 diabetes mellitus without complications: Secondary | ICD-10-CM | POA: Insufficient documentation

## 2015-02-28 DIAGNOSIS — I10 Essential (primary) hypertension: Secondary | ICD-10-CM | POA: Insufficient documentation

## 2015-02-28 MED ORDER — LIDOCAINE HCL 2 % IJ SOLN
10.0000 mL | Freq: Once | INTRAMUSCULAR | Status: AC
  Administered 2015-02-28: 200 mg
  Filled 2015-02-28: qty 20

## 2015-02-28 MED ORDER — TETANUS-DIPHTH-ACELL PERTUSSIS 5-2.5-18.5 LF-MCG/0.5 IM SUSP
0.5000 mL | Freq: Once | INTRAMUSCULAR | Status: AC
  Administered 2015-02-28: 0.5 mL via INTRAMUSCULAR
  Filled 2015-02-28: qty 0.5

## 2015-02-28 NOTE — ED Notes (Signed)
Declined W/C at D/C and was escorted to lobby by RN. 

## 2015-02-28 NOTE — ED Provider Notes (Signed)
CSN: [id number]     Arrival date & time 02/28/15  4098 History   First MD Initiated Contact with Patient 02/28/15 0820     No chief complaint on file.    (Consider location/radiation/quality/duration/timing/severity/associated sxs/prior Treatment) HPI Comments: Pt comes in for wound recheck to her lower lip. She states that she was seen 2 days ago and put on clindamycin, naprosyn and benadryl. She states that the area hasn't gotten any better but it hasn't gotten any worse. Denies fever, trouble swallowing or breathing.  The history is provided by the patient. No language interpreter was used.    Past Medical History  Diagnosis Date  . Hypertension   . Diabetes mellitus without complication (HCC)     Type 2   Past Surgical History  Procedure Laterality Date  . Cesarean section    . Cesarean section     No family history on file. Social History  Substance Use Topics  . Smoking status: Current Every Day Smoker -- 0.50 packs/day  . Smokeless tobacco: Not on file  . Alcohol Use: No   OB History    No data available     Review of Systems  All other systems reviewed and are negative.     Allergies  Shellfish allergy  Home Medications   Prior to Admission medications   Medication Sig Start Date End Date Taking? Authorizing Provider  canagliflozin (INVOKANA) 300 MG TABS tablet Take 300 mg by mouth daily before breakfast.    Historical Provider, MD  citalopram (CELEXA) 20 MG tablet Take 20 mg by mouth daily.    Historical Provider, MD  clindamycin (CLEOCIN) 150 MG capsule Take 1 capsule (150 mg total) by mouth every 6 (six) hours. 02/26/15   Marlon Pel, PA-C  doxycycline (VIBRAMYCIN) 100 MG capsule Take 1 capsule (100 mg total) by mouth 2 (two) times daily. 12/13/14   Santiago Glad, PA-C  HYDROcodone-acetaminophen (NORCO/VICODIN) 5-325 MG per tablet Take 1-2 tablets by mouth every 6 (six) hours as needed. 12/13/14   Heather Laisure, PA-C  insulin detemir (LEVEMIR) 100  UNIT/ML injection Inject 20 Units into the skin daily.    Historical Provider, MD  naproxen (NAPROSYN) 500 MG tablet Take 1 tablet (500 mg total) by mouth 2 (two) times daily. 12/13/14   Santiago Glad, PA-C  POTASSIUM PO Take 1 tablet by mouth daily.    Historical Provider, MD  sitaGLIPtin-metformin (JANUMET) 50-1000 MG per tablet Take 1 tablet by mouth daily.    Historical Provider, MD  spironolactone-hydrochlorothiazide (ALDACTAZIDE) 25-25 MG per tablet Take 1 tablet by mouth daily.    Historical Provider, MD   There were no vitals taken for this visit. Physical Exam  Constitutional: She appears well-developed and well-nourished.  HENT:  Right Ear: External ear normal.  Left Ear: External ear normal.  Swelling and firmness noted to the lower lip. Small scabbed area to right side.no tongue and oral pharyngeal swelling  Cardiovascular: Normal rate and regular rhythm.   Pulmonary/Chest: Effort normal and breath sounds normal.  Musculoskeletal: Normal range of motion.  Nursing note and vitals reviewed.   ED Course  .Marland KitchenIncision and Drainage Date/Time: 02/28/2015 9:13 AM Performed by: Teressa Lower Authorized by: Teressa Lower Consent: Verbal consent obtained. Risks and benefits: risks, benefits and alternatives were discussed Consent given by: patient Patient identity confirmed: verbally with patient Time out: Immediately prior to procedure a "time out" was called to verify the correct patient, procedure, equipment, support staff and site/side marked as required. Type: abscess Body area:  mouth (lower lip) Anesthesia: local infiltration Local anesthetic: lidocaine 2% without epinephrine Scalpel size: 11 Incision type: single straight Complexity: simple Drainage: purulent Drainage amount: scant Wound treatment: wound left open Patient tolerance: Patient tolerated the procedure well with no immediate complications   (including critical care time) Labs Review Labs Reviewed  - No data to display  Imaging Review No results found. I have personally reviewed and evaluated these images and lab results as part of my medical decision-making.   EKG Interpretation None      MDM   Final diagnoses:  Lip abscess   Tetanus updated. Pt given return precaution. Pt is already on clindamycin. Don't think antibiotics need to be changed at this time    Teressa LowerVrinda Renley Banwart, NP 02/28/15 [id number]  Gwyneth SproutWhitney Plunkett, MD 02/28/15 1526

## 2015-02-28 NOTE — Discharge Instructions (Signed)

## 2015-02-28 NOTE — ED Notes (Signed)
PT returns today for recheck of abscess on lower lip. Pt reports condition unchanged.

## 2016-11-16 ENCOUNTER — Ambulatory Visit (INDEPENDENT_AMBULATORY_CARE_PROVIDER_SITE_OTHER): Payer: Self-pay | Admitting: Orthopedic Surgery

## 2016-11-23 ENCOUNTER — Encounter (INDEPENDENT_AMBULATORY_CARE_PROVIDER_SITE_OTHER): Payer: Self-pay | Admitting: Orthopedic Surgery

## 2016-11-23 ENCOUNTER — Ambulatory Visit (INDEPENDENT_AMBULATORY_CARE_PROVIDER_SITE_OTHER): Payer: 59

## 2016-11-23 ENCOUNTER — Ambulatory Visit (INDEPENDENT_AMBULATORY_CARE_PROVIDER_SITE_OTHER): Payer: 59 | Admitting: Orthopedic Surgery

## 2016-11-23 DIAGNOSIS — M25512 Pain in left shoulder: Secondary | ICD-10-CM

## 2016-11-23 DIAGNOSIS — G8929 Other chronic pain: Secondary | ICD-10-CM

## 2016-11-23 NOTE — Progress Notes (Signed)
Office Visit Note   Patient: Terri Black           Date of Birth: 23-Mar-1971           MRN: [id number] Visit Date: 11/23/2016 Requested by: Ralene OkMoreira, Roy, MD [id number] [place], [place] [id number] PCP: Ralene OkMoreira, Roy, MD  Subjective: Chief Complaint  Patient presents with  . Left Shoulder - Pain    HPI: Terri Black is a 46 year old patient with six-month history H medical onset left shoulder pain.  She is right-hand dominant.  She works as an Print production planneroffice manager.  She describes limited and painful range of motion.  The pain will wake her from sleep at night.  Denies any history of trauma.  She is unable to play on the left side.  Raising her arm causes pain.  She cannot put her bra on.  She cannot shave underneath her left arm..  She has been given an injection from the front as well as a sheet of exercises but that hasn't helped much.  She takes naproxen which does help.  Denies any neck pain or radicular symptoms.              ROS: All systems reviewed are negative as they relate to the chief complaint within the history of present illness.  Patient denies  fevers or chills.   Assessment & Plan: Visit Diagnoses:  1. Chronic left shoulder pain     Plan: Impression is left frozen shoulder with normal x-rays and good rotator cuff strength on the left-hand side.  Plan is algorithm neck approach to treatment.  We discussed some exercises plus anti-inflammatories followed by injection and formal physical therapy followed by surgical manipulation and debridement.  Landed this time is for supervised physical therapy was home exercises to be done at least 1-2 hours a day.  If she is not better in 6 weeks than she should come back and we will try an intra-articular cortisone injection.  I'll see her back as needed.  Continue with Naprosyn.  Follow-Up Instructions: No Follow-up on file.   Orders:  Orders Placed This Encounter  Procedures  . XR Shoulder Left   No orders of the defined types were  placed in this encounter.     Procedures: No procedures performed   Clinical Data: No additional findings.  Objective: Vital Signs: There were no vitals taken for this visit.  Physical Exam:   Constitutional: Patient appears well-developed HEENT:  Head: Normocephalic Eyes:EOM are normal Neck: Normal range of motion Cardiovascular: Normal rate Pulmonary/chest: Effort normal Neurologic: Patient is alert Skin: Skin is warm Psychiatric: Patient has normal mood and affect    Ortho Exam: Orthopedic exam demonstrates good cervical spine range of motion 5 out of 5 grip EPL FPL interosseous wrist flexion-extension biceps triceps and upper strength bilaterally.  Radial pulses intact bilaterally.  She does have restricted external rotation on the left compared to the right.  At 15 of abduction and external rotation on the right is to 70 on the left it's to 20.  Isolated glenohumeral abduction on the right 95 on the left 50.  For flexion on the right 180 on the left 100.  No other masses lymph adenopathy or skin changes noted in the left shoulder region.  Specialty Comments:  No specialty comments available.  Imaging: Xr Shoulder Left  Result Date: 11/23/2016 AP lateral and outlet left shoulder reviewed.  Acromiohumeral distance is maintained.  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  No fractures present.  Visualized lung fields clear.  No other abnormal soft tissue calcifications noted    PMFS History: There are no active problems to display for this patient.  Past Medical History:  Diagnosis Date  . Diabetes mellitus without complication (HCC)    Type 2  . Hypertension     No family history on file.  Past Surgical History:  Procedure Laterality Date  . CESAREAN SECTION    . CESAREAN SECTION     Social History   Occupational History  . Not on file.   Social History Main Topics  . Smoking status: Current Every Day Smoker     Packs/day: 0.50  . Smokeless tobacco: Never Used  . Alcohol use No  . Drug use: No  . Sexual activity: Not on file

## 2019-02-26 ENCOUNTER — Other Ambulatory Visit: Payer: Self-pay | Admitting: Internal Medicine

## 2019-02-26 DIAGNOSIS — Z1231 Encounter for screening mammogram for malignant neoplasm of breast: Secondary | ICD-10-CM

## 2019-04-10 ENCOUNTER — Other Ambulatory Visit: Payer: Self-pay

## 2019-04-10 ENCOUNTER — Ambulatory Visit
Admission: RE | Admit: 2019-04-10 | Discharge: 2019-04-10 | Disposition: A | Discharge status: Home / Self Care | Payer: Managed Care, Other (non HMO) | Source: Ambulatory Visit | Attending: Internal Medicine | Admitting: Internal Medicine

## 2019-04-10 DIAGNOSIS — Z1231 Encounter for screening mammogram for malignant neoplasm of breast: Secondary | ICD-10-CM

## 2021-01-06 ENCOUNTER — Other Ambulatory Visit: Payer: Self-pay | Admitting: Internal Medicine

## 2021-01-06 DIAGNOSIS — N6452 Nipple discharge: Secondary | ICD-10-CM

## 2021-02-12 ENCOUNTER — Other Ambulatory Visit: Payer: Self-pay

## 2021-02-12 ENCOUNTER — Ambulatory Visit
Admission: RE | Admit: 2021-02-12 | Discharge: 2021-02-12 | Disposition: A | Discharge status: Home / Self Care | Payer: Managed Care, Other (non HMO) | Source: Ambulatory Visit | Attending: Internal Medicine | Admitting: Internal Medicine

## 2021-02-12 ENCOUNTER — Ambulatory Visit
Admission: RE | Admit: 2021-02-12 | Discharge: 2021-02-12 | Disposition: A | Discharge status: Home / Self Care | Payer: No Typology Code available for payment source | Source: Ambulatory Visit | Attending: Internal Medicine | Admitting: Internal Medicine

## 2021-02-12 DIAGNOSIS — N6452 Nipple discharge: Secondary | ICD-10-CM

## 2022-01-18 ENCOUNTER — Other Ambulatory Visit: Payer: Self-pay | Admitting: Internal Medicine

## 2022-01-18 DIAGNOSIS — Z1231 Encounter for screening mammogram for malignant neoplasm of breast: Secondary | ICD-10-CM

## 2022-02-15 ENCOUNTER — Ambulatory Visit
Admission: RE | Admit: 2022-02-15 | Discharge: 2022-02-15 | Disposition: A | Discharge status: Home / Self Care | Payer: 59 | Source: Ambulatory Visit | Attending: Internal Medicine | Admitting: Internal Medicine

## 2022-02-15 DIAGNOSIS — Z1231 Encounter for screening mammogram for malignant neoplasm of breast: Secondary | ICD-10-CM

## 2023-09-23 IMAGING — MG DIGITAL DIAGNOSTIC BILAT W/ TOMO W/ CAD
6 of 10 series · 6 of 30 positions shown · non-contrast
Comparison: Previous exam(s).

CLINICAL DATA: 50-year-old female with milky left nipple discharge
only when expressed for several months.

EXAM:
DIGITAL DIAGNOSTIC BILATERAL MAMMOGRAM WITH TOMOSYNTHESIS AND CAD;
ULTRASOUND LEFT BREAST LIMITED
TECHNIQUE: Bilateral digital diagnostic mammography and breast tomosynthesis
was performed. The images were evaluated with computer-aided
detection.; Targeted ultrasound examination of the left breast was
performed.

[R MLO synth-2D]
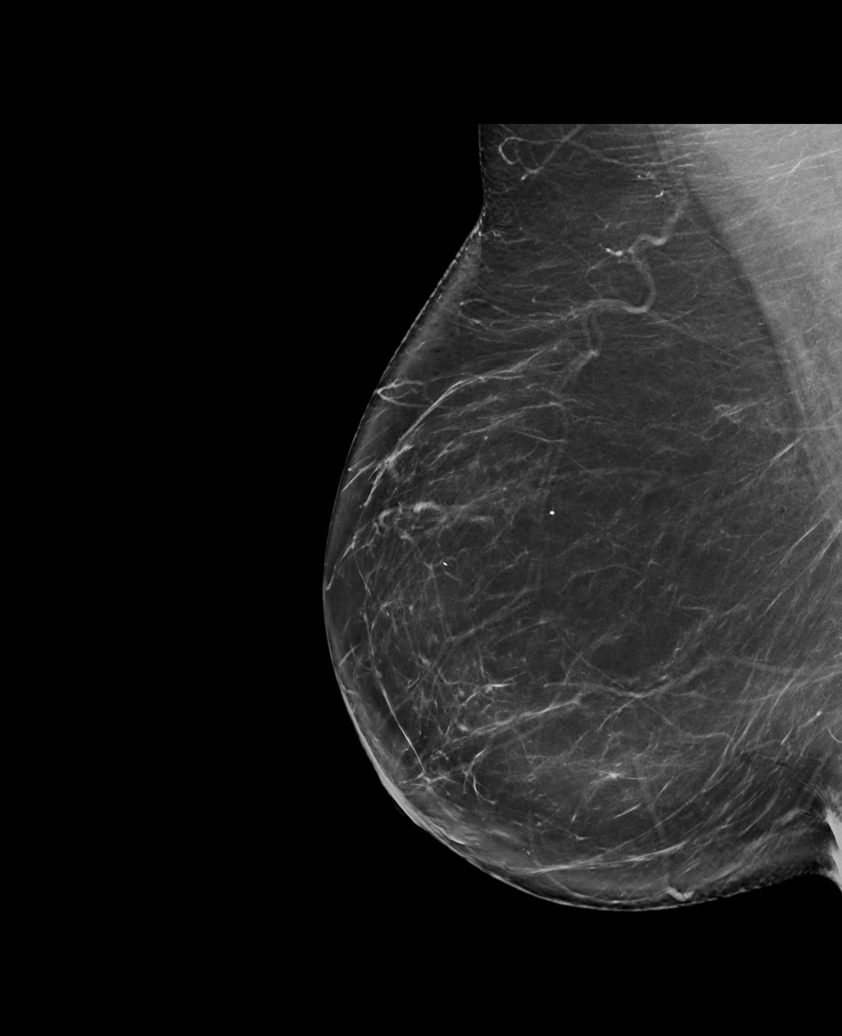

[L CC synth-2D]
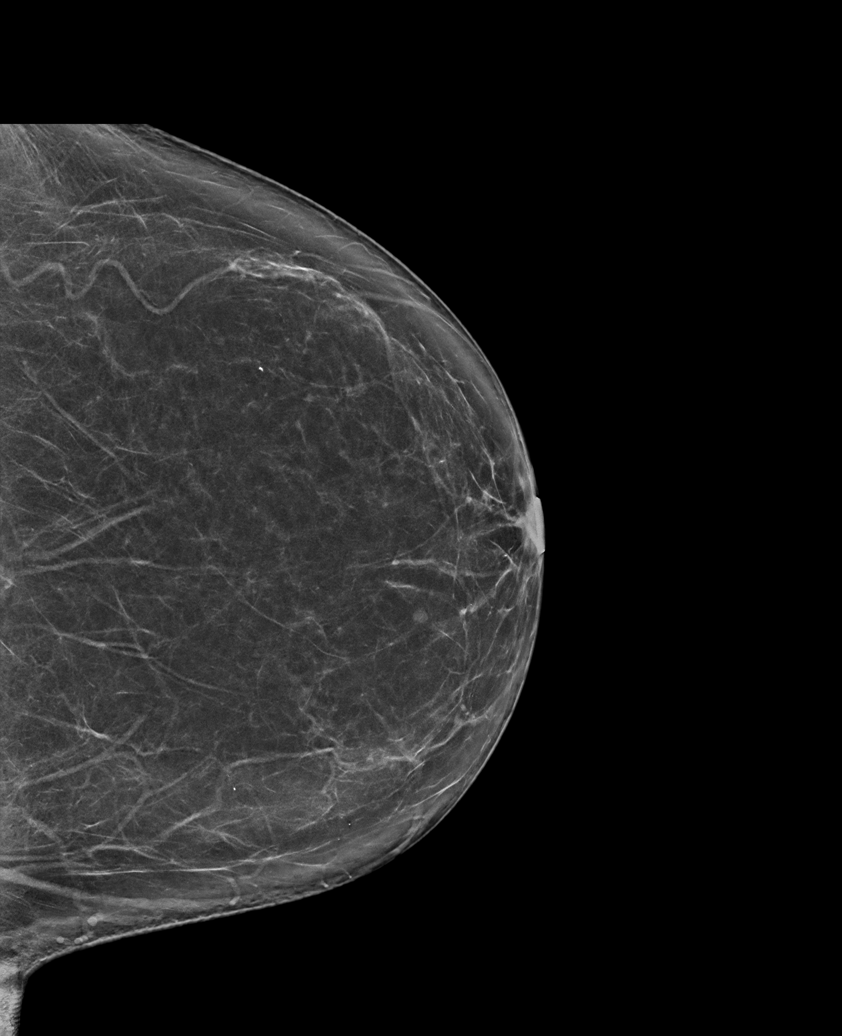

[L MLO synth-2D (1 of 2)]
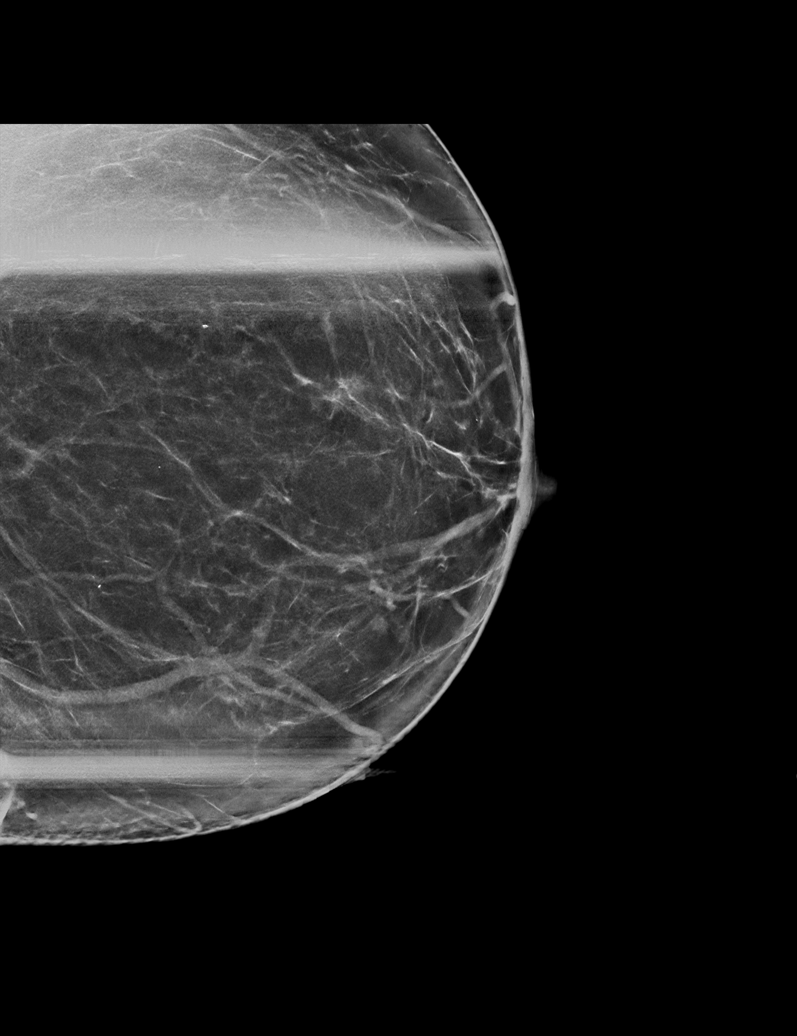

[L MLO synth-2D (2 of 2)]
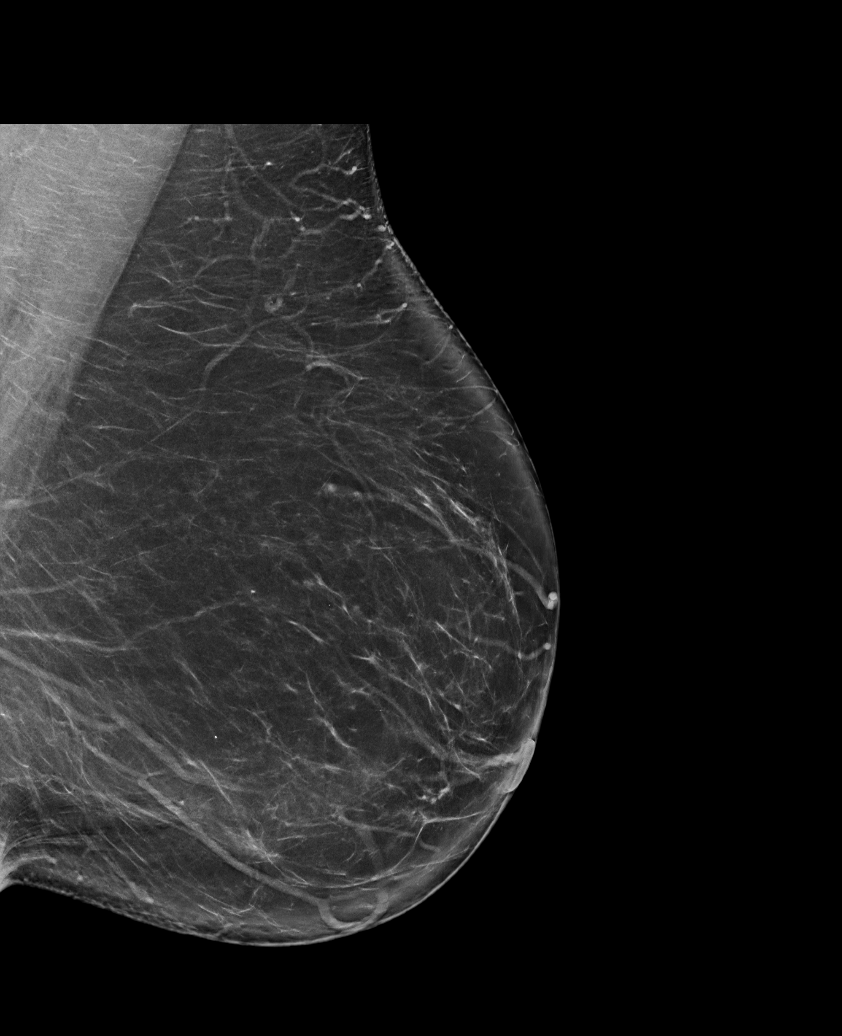

[R CC synth-2D]
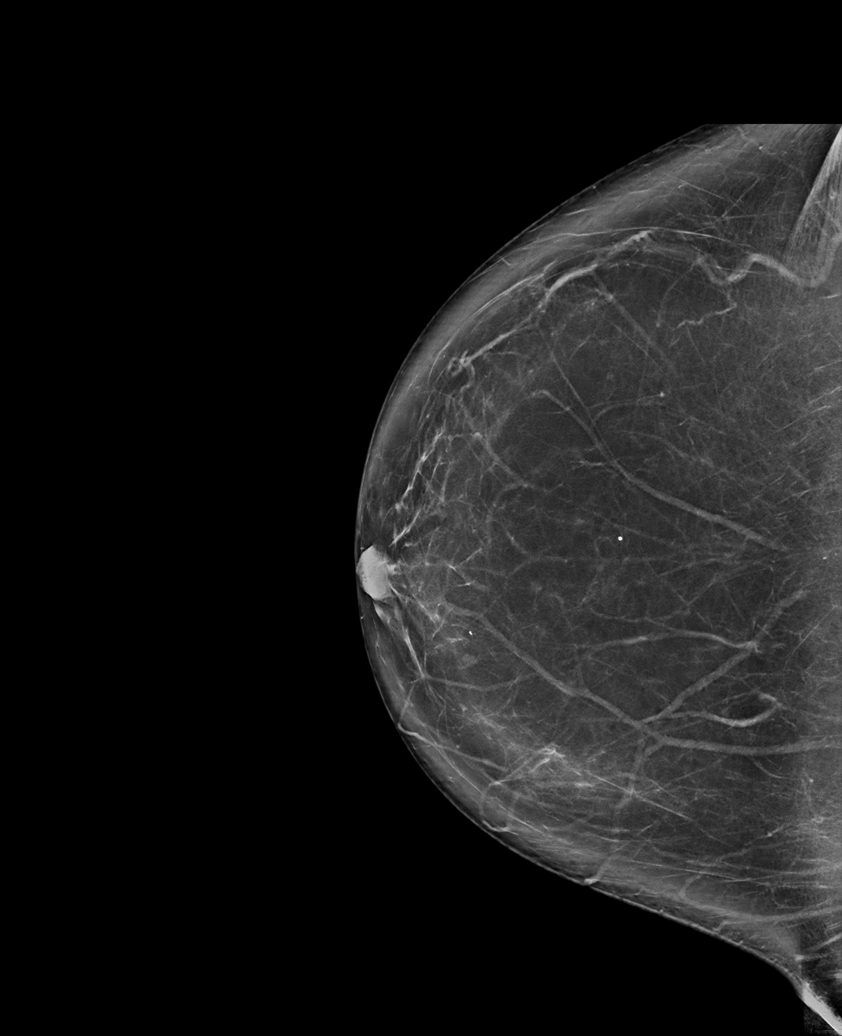

[L MLO tomo · tomo slice 48/95.0]
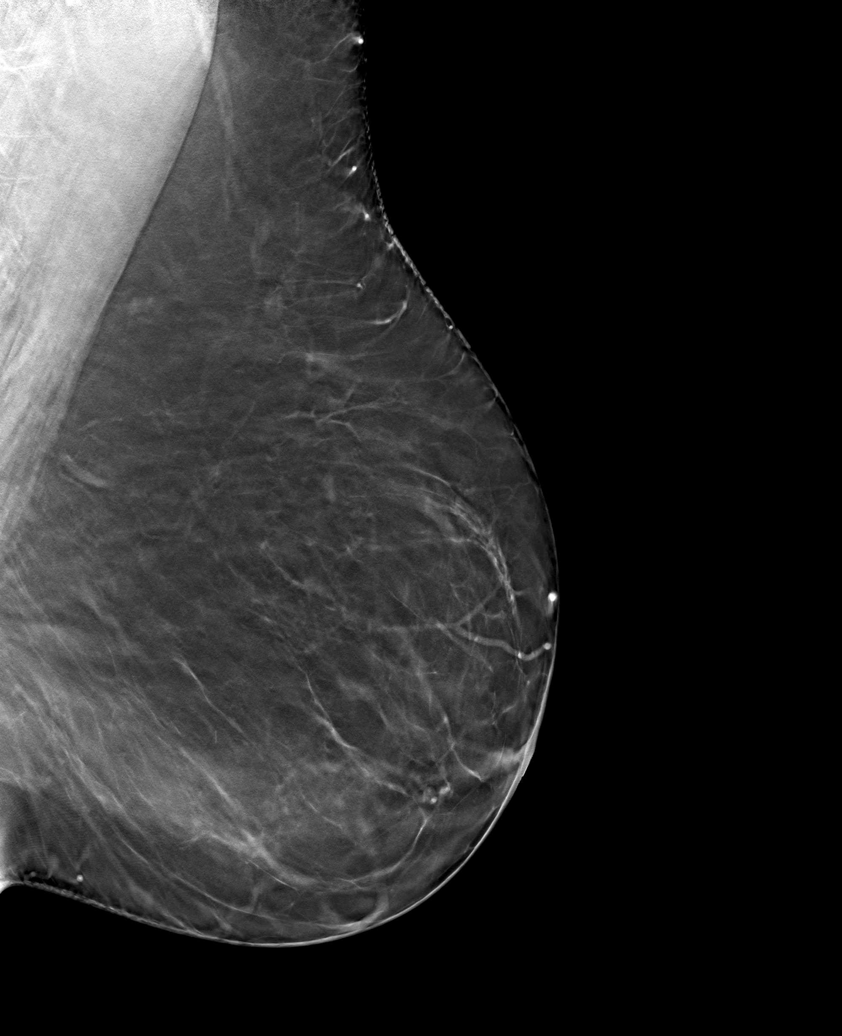

[6 of 30 positions shown; findings below may reference images not displayed]

ACR Breast Density Category b: There are scattered areas of
fibroglandular density.
FINDINGS: An oval, circumscribed equal density mass in the lower inner left
breast at anterior depth is mammographically stable when compared to
prior studies. Otherwise, no new or suspicious findings in either
breast. The parenchymal pattern is stable.

Targeted ultrasound is performed, showing no focal or suspicious
sonographic abnormalities in the subareolar left breast to explain
the patient's nipple discharge. An oval, circumscribed hypoechoic
mass at the 7 o'clock position 2 cm from the nipple measures 4 x 4 x
2 mm. This correlates well with the stable mammographic finding.
IMPRESSION: 1. No mammographic evidence of malignancy in either breast.
2. No suspicious sonographic findings in the subareolar left breast
to explain the patient's nipple discharge.
3. Benign left breast mildly complicated cyst, stable from prior
studies.

RECOMMENDATION:
1. Causes of unilateral nipple discharge include: Hormonal changes,
fibrocystic changes, benign papilloma, abscess/mastitis, birth
control pills, endocrine disorders, injury/trauma to breast, duct
ectasia, medications, prolactinoma, and breast cancer. As is evident
from this list, nipple discharge often stems from a benign
condition, however, breast cancer is a possibility when unilateral
spontaneous persistent single duct discharge (especially bloody or
clear discharge) is present.
2.  Screening mammogram in one year.(Code:[id number]-91V)

I have discussed the findings and recommendations with the patient.
If applicable, a reminder letter will be sent to the patient
regarding the next appointment.

BI-RADS CATEGORY  2: Benign.
